# Patient Record
Sex: Male | Born: 1940 | Race: White | Hispanic: No | Marital: Married | Smoking: Former smoker
Health system: Southern US, Community
[De-identification: ages and names within clinical notes are randomized; demographics above are authoritative.]

## PROBLEM LIST (undated history)

## (undated) DIAGNOSIS — E7800 Pure hypercholesterolemia, unspecified: Secondary | ICD-10-CM

## (undated) DIAGNOSIS — N419 Inflammatory disease of prostate, unspecified: Secondary | ICD-10-CM

## (undated) HISTORY — PX: NASAL SEPTUM SURGERY: SHX37

---

## 2008-08-02 ENCOUNTER — Ambulatory Visit: Payer: Self-pay | Admitting: Family Medicine

## 2009-08-21 IMAGING — US US RENAL KIDNEY
1 series · 17 of 19 positions shown · non-contrast
Comparison: none

REASON FOR EXAM: Cramping Back Pain Nausea Eval for Kidney Stones Call
COMMENTS:

[Series 1: us renal kidney · 17 of 19 slices shown]
[im 1/19]
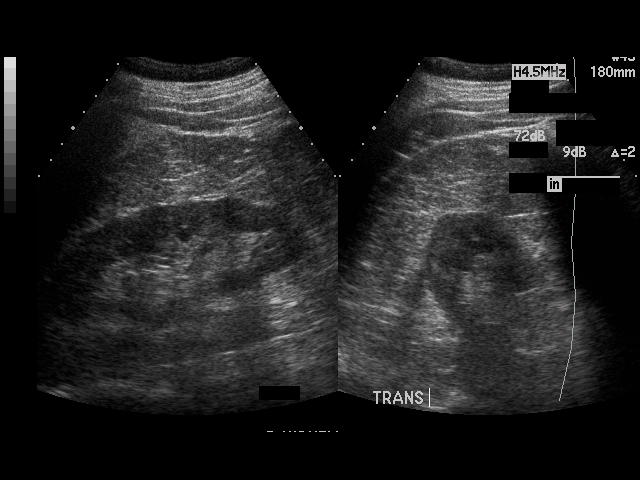
[im 2/19]
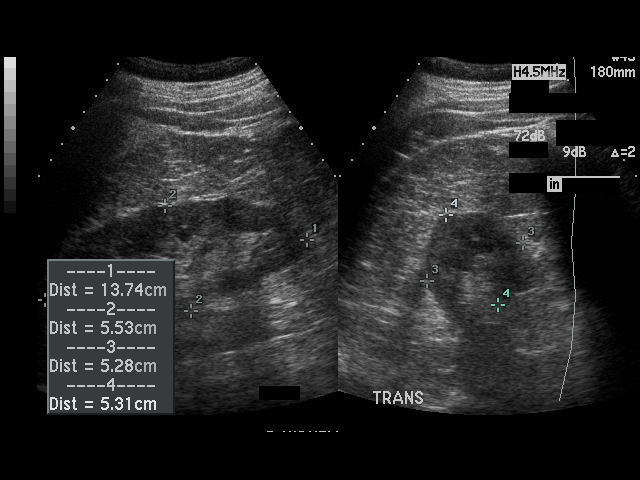
[im 3/19]
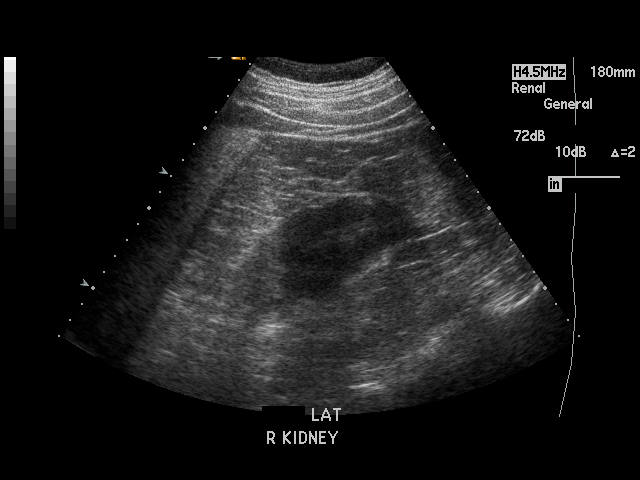
[im 4/19]
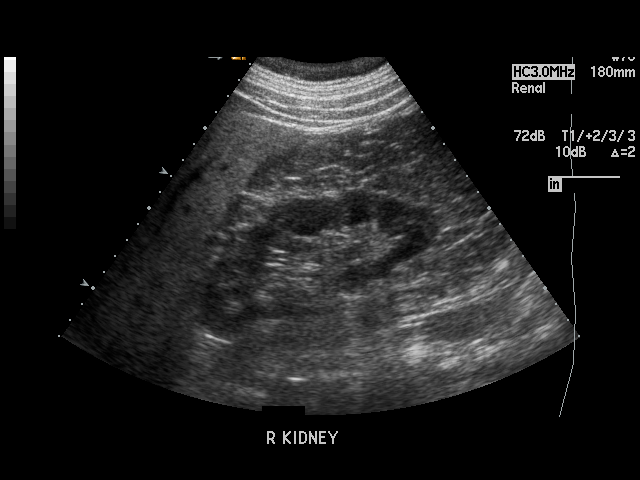
[im 6/19]
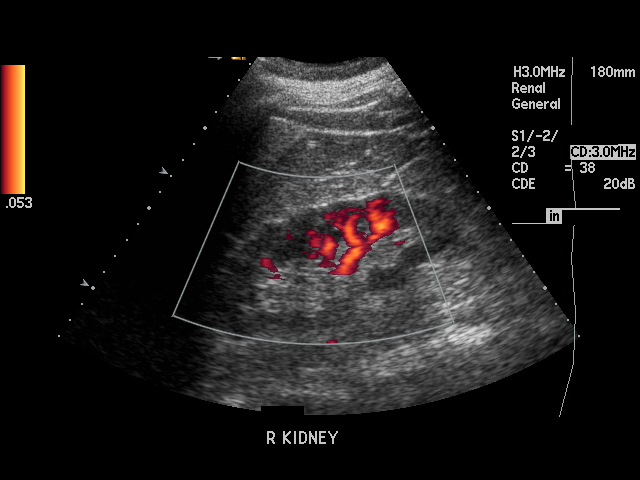
[im 7/19]
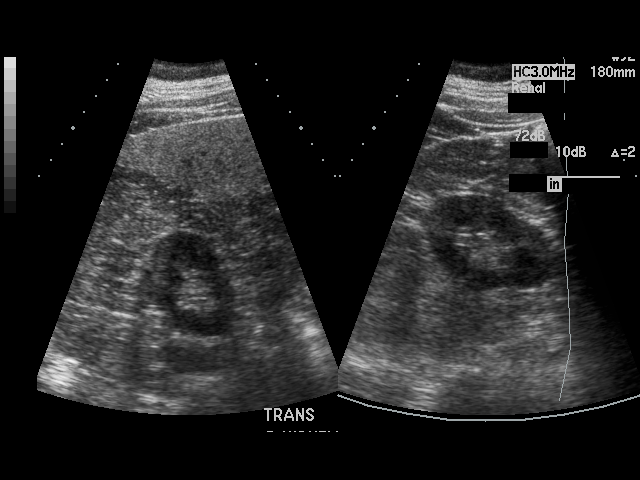
[im 8/19]
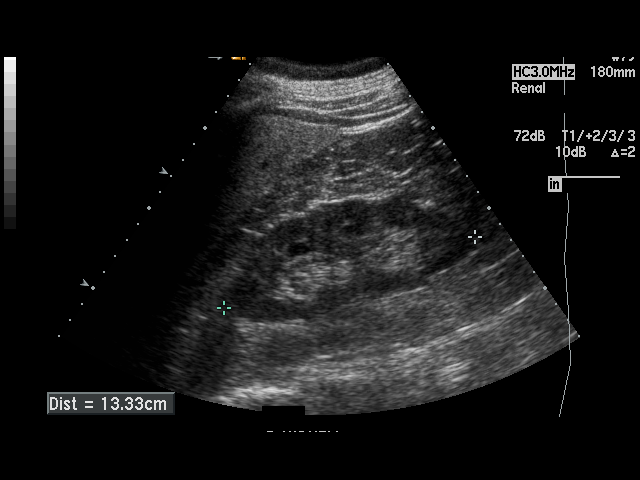
[im 9/19]
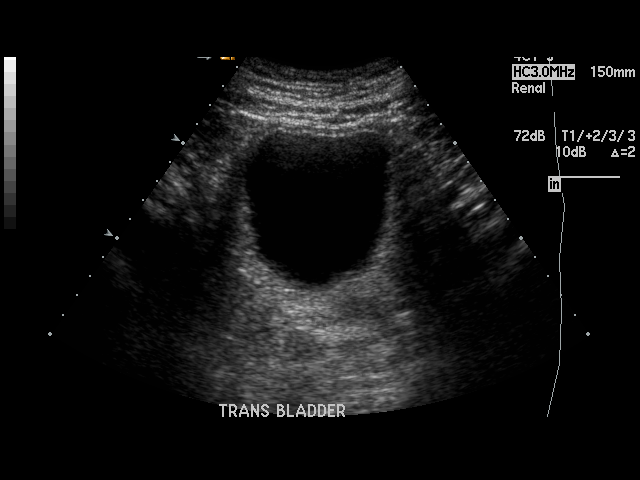
[im 10/19]
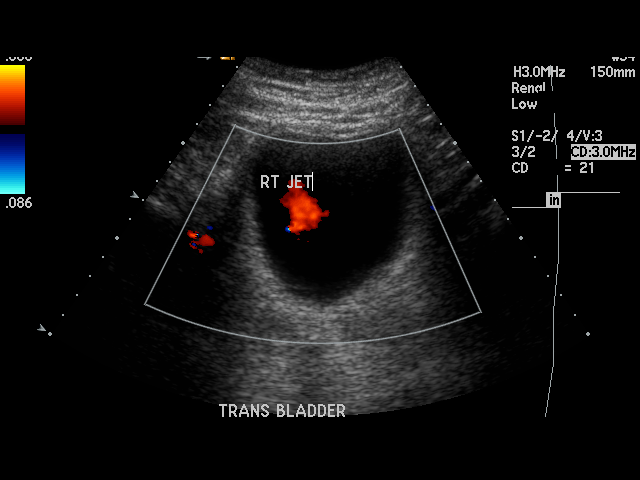
[im 11/19]
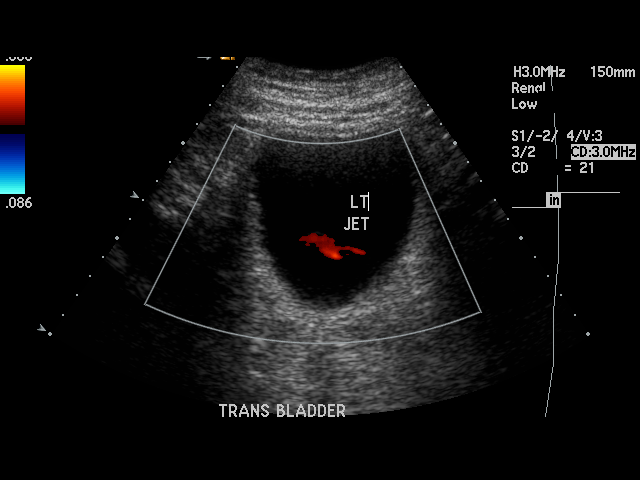
[im 12/19]
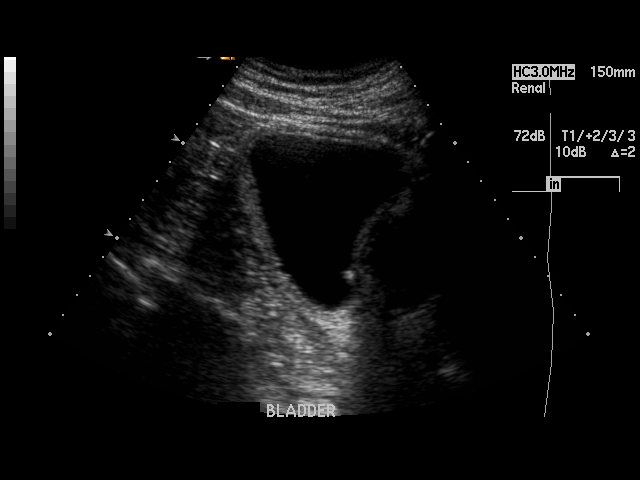
[im 13/19]
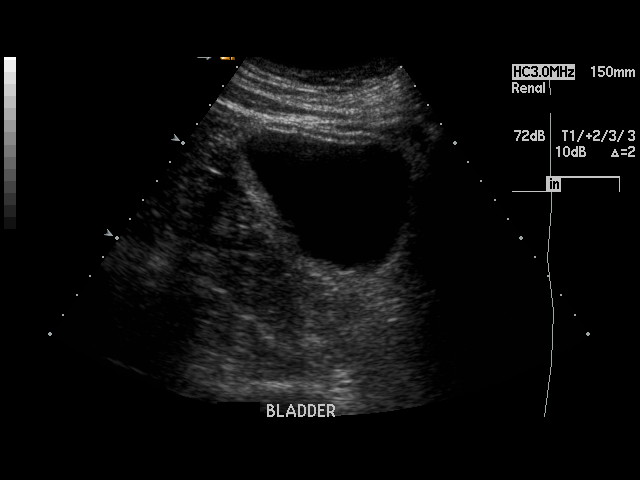
[im 14/19]
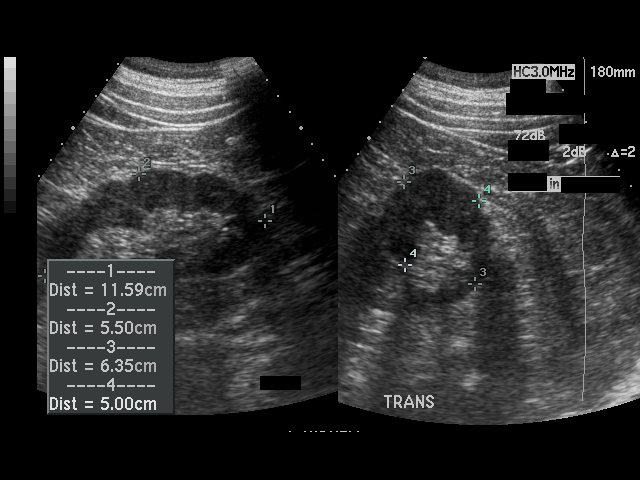
[im 16/19]
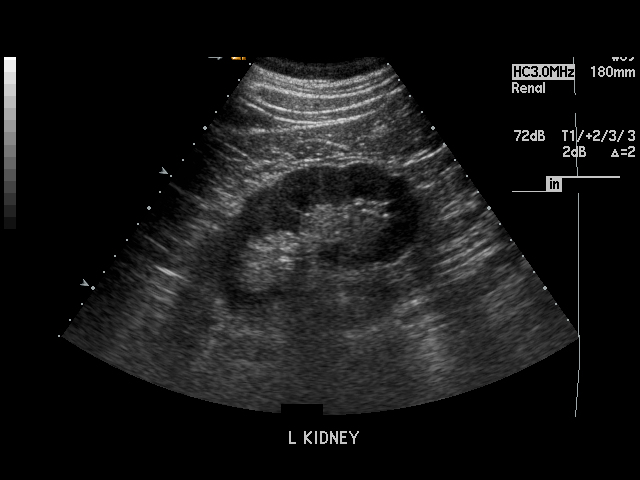
[im 17/19]
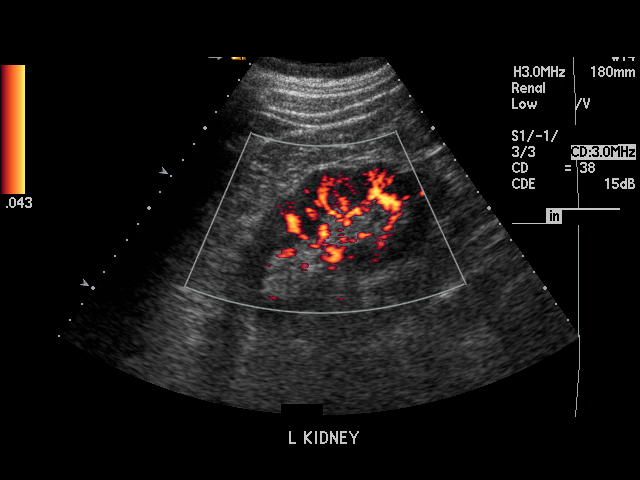
[im 18/19]
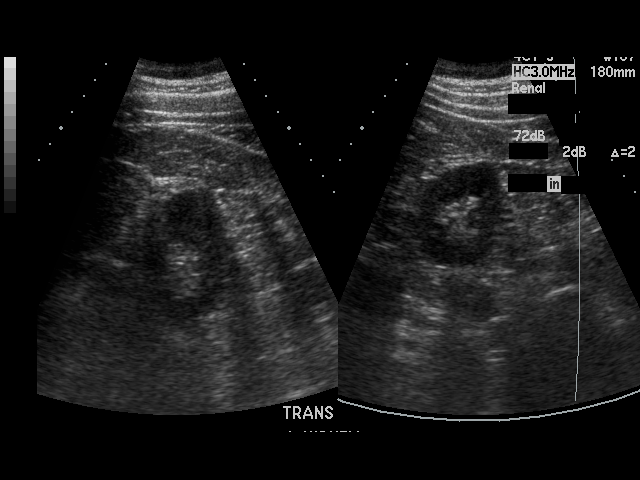
[im 19/19]
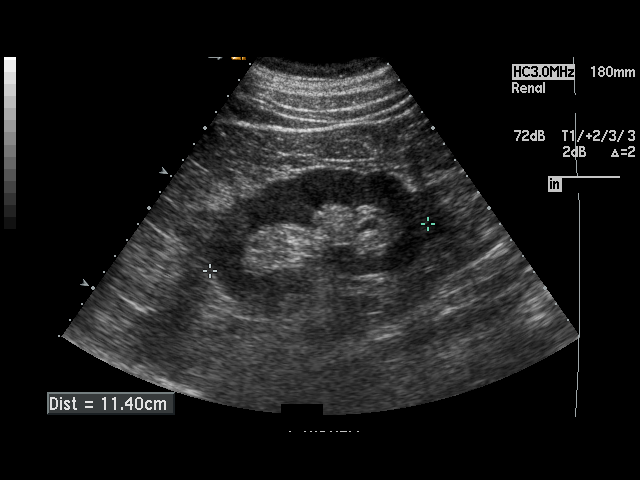

[17 of 19 positions shown; findings below may reference images not displayed]

PROCEDURE:     US  - US KIDNEY  - August 02, 2008  [DATE]

RESULT:     The RIGHT kidney measures 13.33 x 5.28 x 5.31 cm and the LEFT
11.59 x 6.35 x 5.0 cm. There is appropriate corticomedullary differentiation
without evidence of hydronephrosis, masses or calculi. The urinary bladder
is partially distended with urine.
IMPRESSION: Unremarkable bilateral renal ultrasound as described above.

## 2011-10-06 ENCOUNTER — Ambulatory Visit: Payer: Self-pay | Admitting: Family Medicine

## 2014-08-22 ENCOUNTER — Ambulatory Visit: Payer: Self-pay | Admitting: Unknown Physician Specialty

## 2014-11-04 LAB — SURGICAL PATHOLOGY

## 2015-11-25 ENCOUNTER — Encounter: Payer: Self-pay | Admitting: *Deleted

## 2015-11-25 ENCOUNTER — Ambulatory Visit (INDEPENDENT_AMBULATORY_CARE_PROVIDER_SITE_OTHER): Payer: Medicare PPO

## 2015-11-25 ENCOUNTER — Ambulatory Visit
Admission: EM | Admit: 2015-11-25 | Discharge: 2015-11-25 | Disposition: A | Discharge status: Home / Self Care | Payer: Medicare PPO | Attending: Family Medicine | Admitting: Family Medicine

## 2015-11-25 DIAGNOSIS — S93601A Unspecified sprain of right foot, initial encounter: Secondary | ICD-10-CM | POA: Diagnosis not present

## 2015-11-25 HISTORY — DX: Pure hypercholesterolemia, unspecified: E78.00

## 2015-11-25 HISTORY — DX: Inflammatory disease of prostate, unspecified: N41.9

## 2015-11-25 MED ORDER — CELECOXIB 200 MG PO CAPS: 200.0000 mg | ORAL_CAPSULE | Freq: Two times a day (BID) | ORAL | Status: DC

## 2015-11-25 NOTE — Discharge Instructions (Signed)
Elastic Bandage and RICE °WHAT DOES AN ELASTIC BANDAGE DO? °Elastic bandages come in different shapes and sizes. They generally provide support to your injury and reduce swelling while you are healing, but they can perform different functions. Your health care provider will help you to decide what is best for your protection, recovery, or rehabilitation following an injury. °WHAT ARE SOME GENERAL TIPS FOR USING AN ELASTIC BANDAGE? °· Use the bandage as directed by the maker of the bandage that you are using. °· Do not wrap the bandage too tightly. This may cut off the circulation in the arm or leg in the area below the bandage. °¨ If part of your body beyond the bandage becomes blue, numb, cold, swollen, or is more painful, your bandage is most likely too tight. If this occurs, remove your bandage and reapply it more loosely. °· See your health care provider if the bandage seems to be making your problems worse rather than better. °· An elastic bandage should be removed and reapplied every 3-4 hours or as directed by your health care provider. °WHAT IS RICE? °The routine care of many injuries includes rest, ice, compression, and elevation (RICE therapy).  °Rest °Rest is required to allow your body to heal. Generally, you can resume your routine activities when you are comfortable and have been given permission by your health care provider. °Ice °Icing your injury helps to keep the swelling down and it reduces pain. Do not apply ice directly to your skin. °· Put ice in a plastic bag. °· Place a towel between your skin and the bag. °· Leave the ice on for 20 minutes, 2-3 times per day. °Do this for as long as you are directed by your health care provider. °Compression °Compression helps to keep swelling down, gives support, and helps with discomfort. Compression may be done with an elastic bandage. °Elevation °Elevation helps to reduce swelling and it decreases pain. If possible, your injured area should be placed at  or above the level of your heart or the center of your chest. °WHEN SHOULD I SEEK MEDICAL CARE? °You should seek medical care if: °· You have persistent pain and swelling. °· Your symptoms are getting worse rather than improving. °These symptoms may indicate that further evaluation or further X-rays are needed. Sometimes, X-rays may not show a small broken bone (fracture) until a number of days later. Make a follow-up appointment with your health care provider. Ask when your X-ray results will be ready. Make sure that you get your X-ray results. °WHEN SHOULD I SEEK IMMEDIATE MEDICAL CARE? °You should seek immediate medical care if: °· You have a sudden onset of severe pain at or below the area of your injury. °· You develop redness or increased swelling around your injury. °· You have tingling or numbness at or below the area of your injury that does not improve after you remove the elastic bandage. °  °This information is not intended to replace advice given to you by your health care provider. Make sure you discuss any questions you have with your health care provider. °  °Document Released: 12/18/2001 Document Revised: 03/19/2015 Document Reviewed: 02/11/2014 °Elsevier Interactive Patient Education ©2016 [employer] ° °

## 2015-11-25 NOTE — ED Provider Notes (Signed)
CSN: [id number]     Arrival date & time 11/25/15  7829 History   First MD Initiated Contact with Patient 11/25/15 [phone]     Chief Complaint  Patient presents with  . Foot Injury   (Consider location/radiation/quality/duration/timing/severity/associated sxs/prior Treatment) HPI   75 year old gentleman who presents with right lateral foot pain indicating the base of the fifth metatarsal that occurred on Sunday playing golf. States that he rolled his right foot into inversion injuring his foot. States it hurt worse that night but yesterday was much worse which prompted today's visit. There is some redness and swelling in the area maximum tenderness along the proximal shaft near the base of the fifth.      Past Medical History  Diagnosis Date  . Hypercholesteremia   . Prostatitis    Past Surgical History  Procedure Laterality Date  . Nasal septum surgery     History reviewed. No pertinent family history. Social History  Substance Use Topics  . Smoking status: Former Games developer  . Smokeless tobacco: Never Used  . Alcohol Use: Yes    Review of Systems  Constitutional: Positive for activity change. Negative for fever, chills and fatigue.  Musculoskeletal: Positive for joint swelling and gait problem.  All other systems reviewed and are negative.   Allergies  Review of patient's allergies indicates no known allergies.  Home Medications   Prior to Admission medications   Medication Sig Start Date End Date Taking? Authorizing Provider  simvastatin (ZOCOR) 5 MG tablet Take 5 mg by mouth daily at 6 PM.   Yes Historical Provider, MD  terazosin (HYTRIN) 5 MG capsule Take 5 mg by mouth at bedtime.   Yes Historical Provider, MD  celecoxib (CELEBREX) 200 MG capsule Take 1 capsule (200 mg total) by mouth 2 (two) times daily. 11/25/15   Lutricia Feil, PA-C   Meds Ordered and Administered this Visit  Medications - No data to display  BP 144/63 mmHg  Pulse 66  Temp(Src) 97.9 F (36.6  C) (Oral)  Resp 18  Ht  (1.803 m)  Wt 212 lb (96.163 kg)  BMI 29.58 kg/m2  SpO2 100% No data found.   Physical Exam  Constitutional: He is oriented to person, place, and time. He appears well-developed and well-nourished. No distress.  HENT:  Head: Normocephalic and atraumatic.  Eyes: Conjunctivae are normal. Pupils are equal, round, and reactive to light.  Neck: Normal range of motion. Neck supple.  Musculoskeletal: Normal range of motion. He exhibits tenderness. He exhibits no edema.  Examination of the right foot shows some swelling over the lateral aspect. There is tenderness to palpation over the proximal shaft of the fifth metatarsal near the base at this or ecchymosis is appreciated. Patient does have a slight antalgic gait on the right.  Neurological: He is alert and oriented to person, place, and time.  Skin: Skin is warm and dry. He is not diaphoretic.  Psychiatric: He has a normal mood and affect. His behavior is normal. Judgment and thought content normal.  Vitals reviewed.   ED Course  Procedures (including critical care time)  Labs Review Labs Reviewed - No data to display  Imaging Review Dg Foot Complete Right  11/25/2015  CLINICAL DATA:  Rolled foot playing golf. Lateral foot pain at fifth metatarsal base for 2 days. Initial encounter. EXAM: RIGHT FOOT COMPLETE - 3+ VIEW COMPARISON:  None. FINDINGS: There is no evidence of fracture or dislocation. There is no evidence of arthropathy small plantar and dorsal calcaneal  bone spurs noted. IMPRESSION: No acute findings. Electronically Signed   By: Raymond RosenthalJohn  Fletcher M.D.   On: 11/25/2015 09:08     Visual Acuity Review  Right Eye Distance:   Left Eye Distance:   Bilateral Distance:    Right Eye Near:   Left Eye Near:    Bilateral Near:         MDM   1. Sprain of right foot, initial encounter    Discharge Medication List as of 11/25/2015  9:20 AM    Plan: 1. Test/x-ray results and diagnosis reviewed  with patient 2. rx as per orders; risks, benefits, potential side effects reviewed with patient 3. Recommend supportive treatment with symptom avoidance. Recommend ice and elevation with elastic wrap when necessary. Follow-up with a podiatrist to discuss symptoms continue. 4. F/u prn if symptoms worsen or don't improve      Lutricia FeilWilliam P Roemer, PA-C 11/25/15 [phone]

## 2015-11-25 NOTE — ED Notes (Signed)
Patient injured his right foot while playing golf 2 days ago. Swelling is visible on the dorsal lateral side of the right foot. Patient injured his foot the same way 5 years ago.

## 2016-12-13 IMAGING — CR DG FOOT COMPLETE 3+V*R*
3 series · 3 of 3 positions shown · non-contrast
Comparison: None.

CLINICAL DATA: Rolled foot playing golf. Lateral foot pain at fifth
metatarsal base for 2 days. Initial encounter.

EXAM:
RIGHT FOOT COMPLETE - 3+ VIEW

[foot ap]
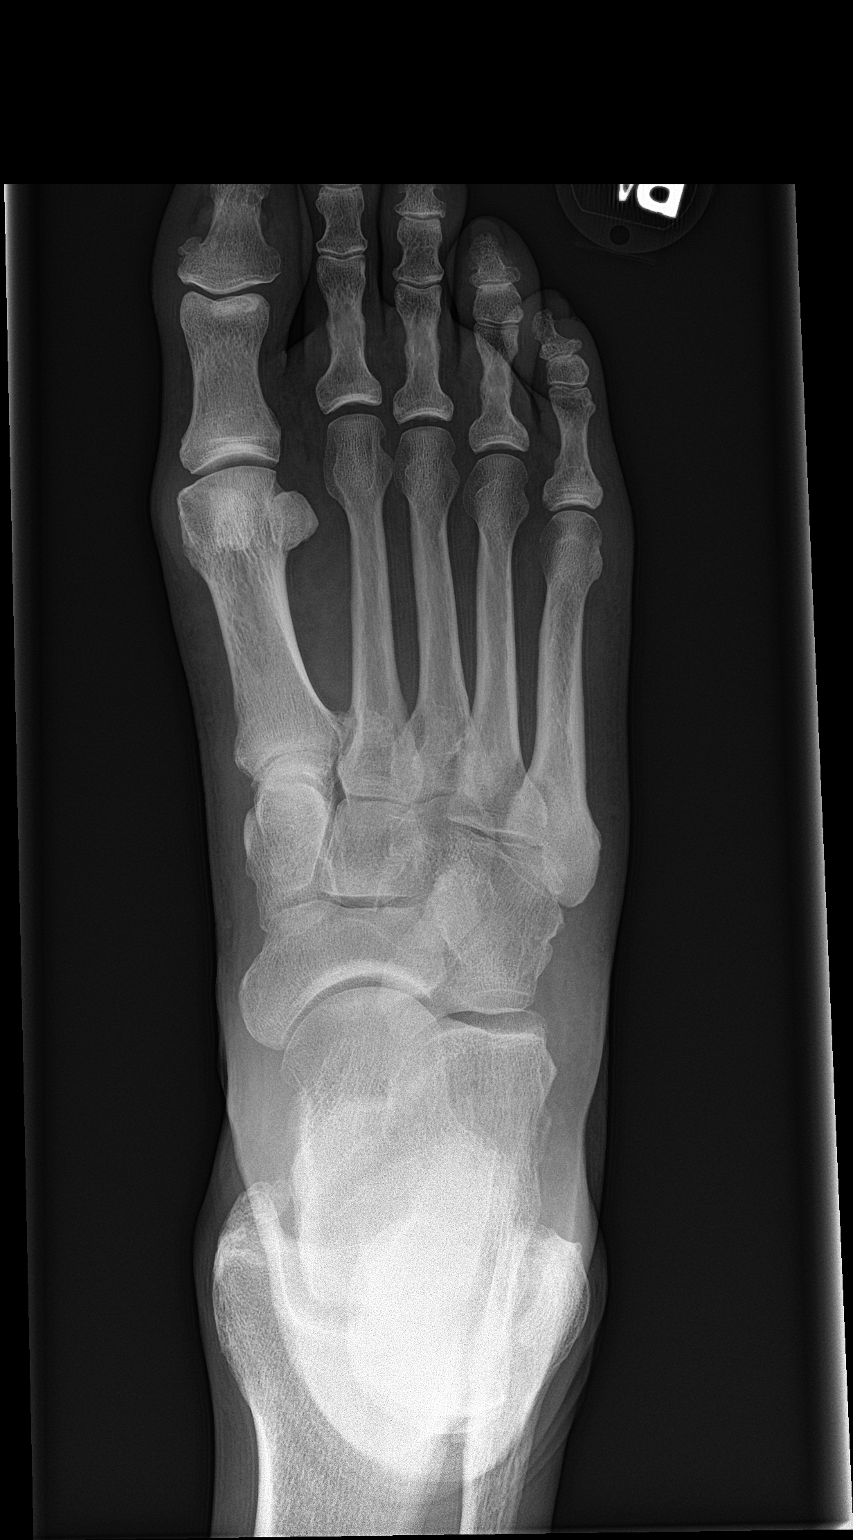

[foot obl]
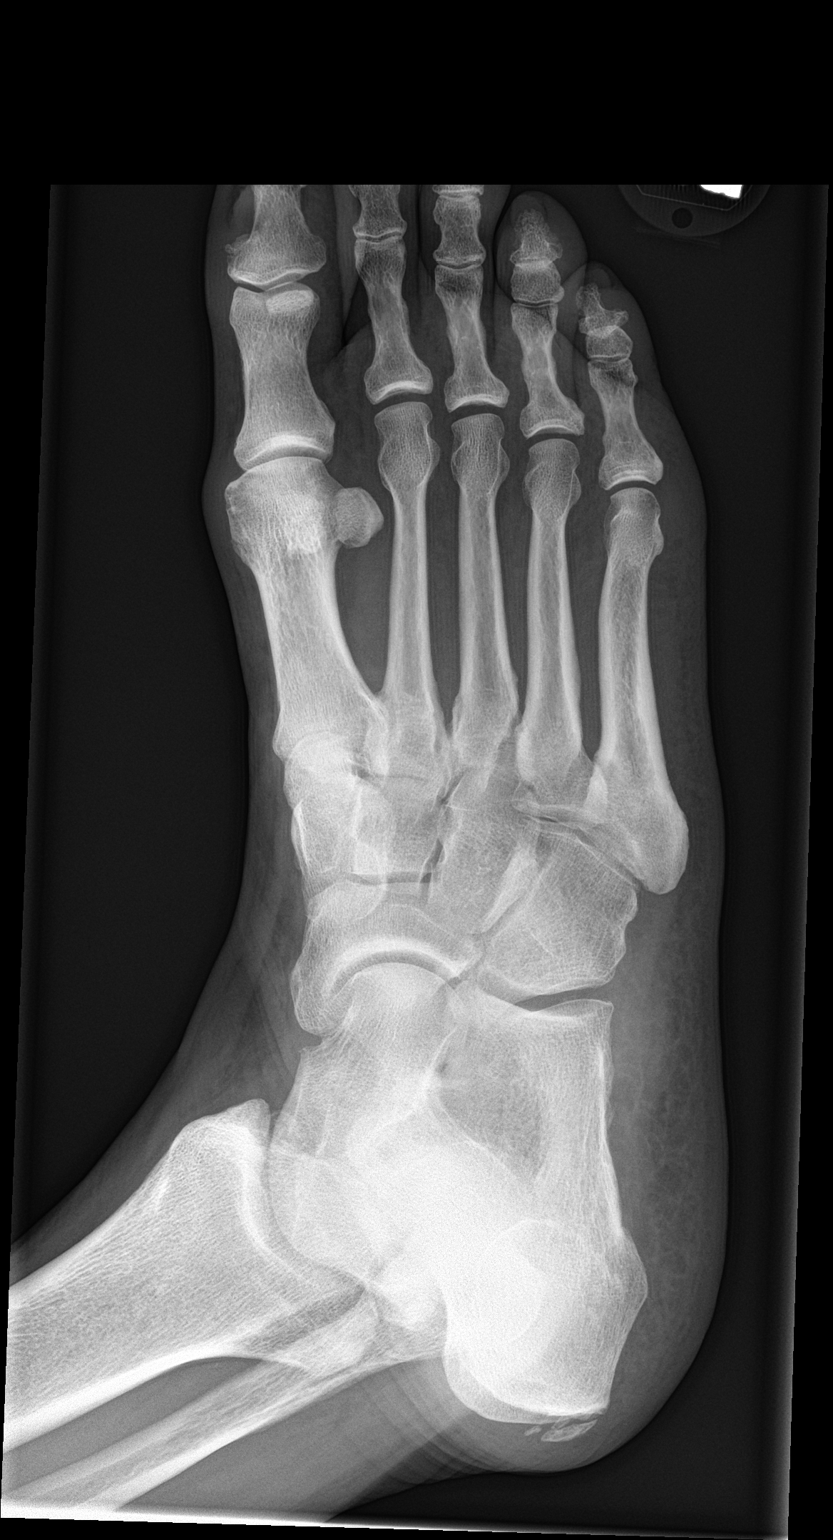

[foot lat]
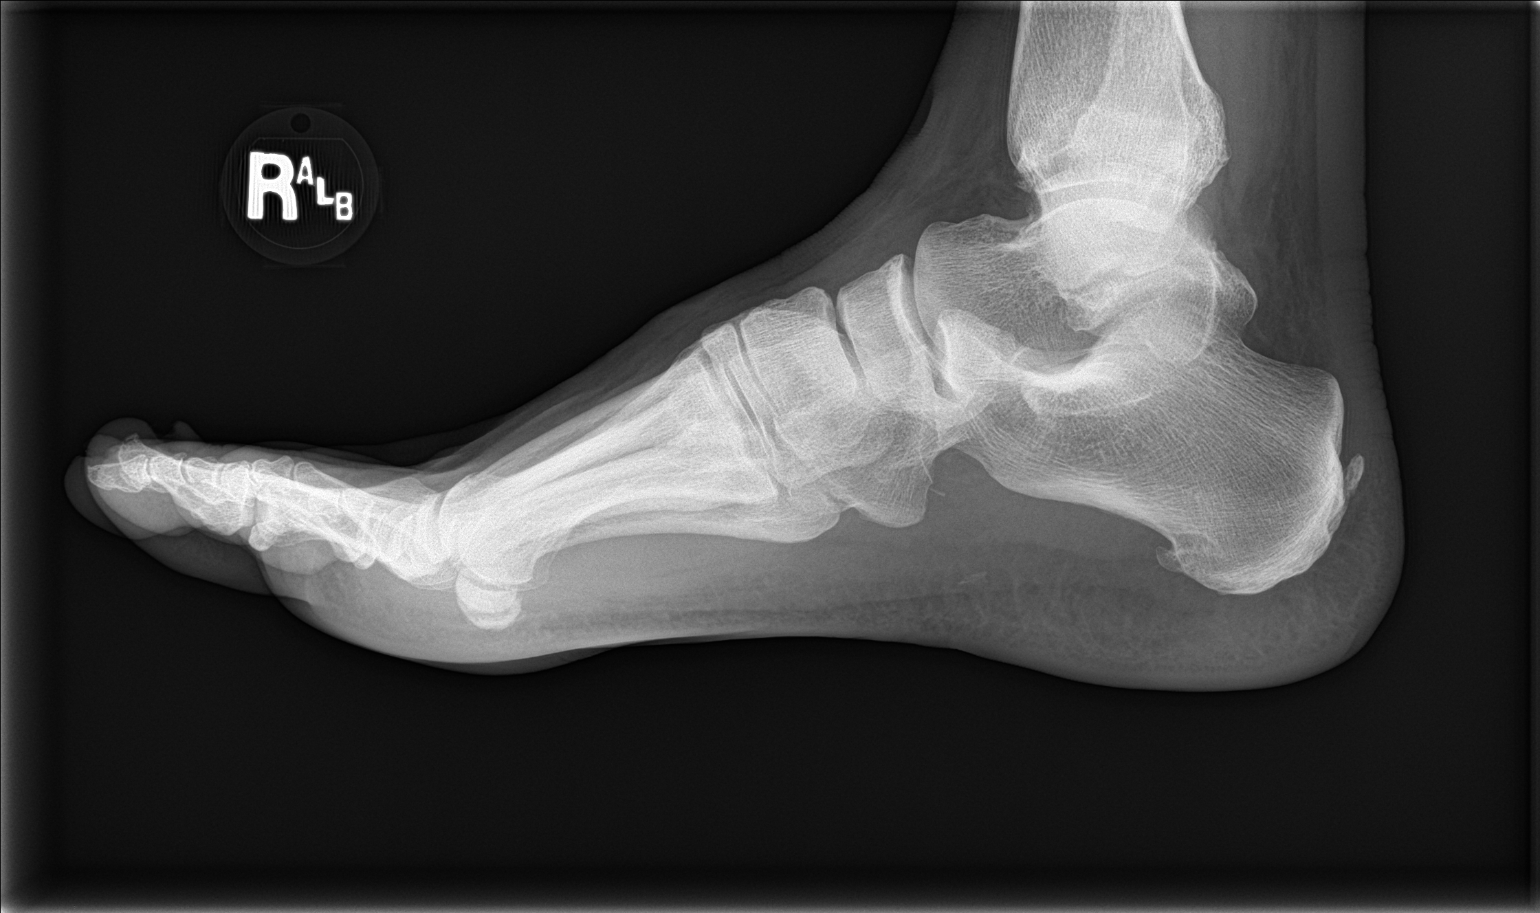

[3 of 3 positions shown; findings below may reference images not displayed]

FINDINGS: There is no evidence of fracture or dislocation. There is no
evidence of arthropathy small plantar and dorsal calcaneal bone
spurs noted.
IMPRESSION: No acute findings.

## 2017-06-18 ENCOUNTER — Other Ambulatory Visit: Payer: Self-pay

## 2017-06-18 ENCOUNTER — Ambulatory Visit
Admission: EM | Admit: 2017-06-18 | Discharge: 2017-06-18 | Disposition: A | Discharge status: Home / Self Care | Payer: Medicare PPO | Attending: Emergency Medicine | Admitting: Emergency Medicine

## 2017-06-18 ENCOUNTER — Encounter: Payer: Self-pay | Admitting: Gynecology

## 2017-06-18 DIAGNOSIS — L03032 Cellulitis of left toe: Secondary | ICD-10-CM

## 2017-06-18 MED ORDER — DOXYCYCLINE HYCLATE 100 MG PO CAPS: 100.0000 mg | ORAL_CAPSULE | Freq: Two times a day (BID) | ORAL | 0 refills | Status: AC

## 2017-06-18 NOTE — Discharge Instructions (Signed)
Soak your foot in tap water in diluted chlorhexidine/Hibiclens soap several times a day.  Ask for this at the drugstore counter.

## 2017-06-18 NOTE — ED Provider Notes (Signed)
HPI  SUBJECTIVE:  Raymond Fletcher is a 76 y.o. male who presents with 3 days of throbbing, stabbing, first left toe pain, erythema, edema.  States the pain is constant and it is now extending to his foot.  He thought he might have an ingrown toenail and tried lifting it with tweezers states that he broke the skin but had no purulent drainage.  Denies fevers, body aches, trauma, numbness, tingling.  No antibiotics in the past month.  No antipyretic in the past 6-8 hours.  He tried Neosporin without improvement in symptoms.  Symptoms are worse wearing shoes, palpation.  He has had identical symptoms performed which were treated with an unknown antibiotic.  He has a past medical history of onychomycosis, no history of diabetes, hypertension.  ZOX:WRUEAVPMD:[place], [facility]   Past Medical History:  Diagnosis Date  . Hypercholesteremia   . Prostatitis     Past Surgical History:  Procedure Laterality Date  . NASAL SEPTUM SURGERY      No family history on file.  Social History   Tobacco Use  . Smoking status: Former Games developermoker  . Smokeless tobacco: Never Used  Substance Use Topics  . Alcohol use: Yes  . Drug use: No    No current facility-administered medications for this encounter.   Current Outpatient Medications:  .  celecoxib (CELEBREX) 200 MG capsule, Take 1 capsule (200 mg total) by mouth 2 (two) times daily., Disp: 30 capsule, Rfl: 0 .  simvastatin (ZOCOR) 5 MG tablet, Take 5 mg by mouth daily at 6 PM., Disp: , Rfl:  .  doxycycline (VIBRAMYCIN) 100 MG capsule, Take 1 capsule (100 mg total) by mouth 2 (two) times daily for 7 days., Disp: 14 capsule, Rfl: 0  No Known Allergies   ROS  As noted in HPI.   Physical Exam  BP 120/76 (BP Location: Left Arm)   Pulse 61   Temp 97.7 F (36.5 C) (Oral)   Resp 16   Ht 5\' 10"  (1.778 m)   Wt 215 lb (97.5 kg)   SpO2 98%   BMI 30.85 kg/m   Constitutional: Well developed, well nourished, no acute distress Eyes:  EOMI, conjunctiva  normal bilaterally HENT: Normocephalic, atraumatic,mucus membranes moist Respiratory: Normal inspiratory effort Cardiovascular: Normal rate GI: nondistended skin: No rash, skin intact Musculoskeletal: Onychomycosis all nails.  Tenderness, erythema, edema at base of left great toe.  No redness streaking up the foot.   No tenderness along the nail folds.  No expressible purulent drainage.  See picture.   Neurologic: Alert & oriented x 3, no focal neuro deficits Psychiatric: Speech and behavior appropriate   ED Course   Medications - No data to display  No orders of the defined types were placed in this encounter.   No results found for this or any previous visit (from the past 24 hour(s)). No results found.  ED Clinical Impression  Cellulitis of toe of left foot   ED Assessment/Plan  Presentation consistent with cellulitis.  Home with doxycycline 100 mg p.o. twice daily for 1 week, chlorhexidine/tap water soaks, follow-up with Dr. Ether GriffinsFowler, his podiatrist in several days if not getting any better.  To the ER if he gets worse.  Discussed MDM, plan and followup with patient. Discussed sn/sx that should prompt return to the ED. patient agrees with plan.   Meds ordered this encounter  Medications  . doxycycline (VIBRAMYCIN) 100 MG capsule    Sig: Take 1 capsule (100 mg total) by mouth 2 (two)  times daily for 7 days.    Dispense:  14 capsule    Refill:  0    *This clinic note was created using Scientist, clinical (histocompatibility and immunogenetics)Dragon dictation software. Therefore, there may be occasional mistakes despite careful proofreading.   ?    Domenick GongMortenson, Cay Kath, MD 06/18/17 [phone]

## 2017-06-18 NOTE — ED Triage Notes (Signed)
Per patient x 3 days left big toe infection x 3 days ago.

## 2021-05-01 DIAGNOSIS — Z23 Encounter for immunization: Secondary | ICD-10-CM | POA: Diagnosis not present

## 2021-06-27 ENCOUNTER — Ambulatory Visit
Admission: EM | Admit: 2021-06-27 | Discharge: 2021-06-27 | Disposition: A | Discharge status: Home / Self Care | Payer: Medicare PPO | Attending: Emergency Medicine | Admitting: Emergency Medicine

## 2021-06-27 ENCOUNTER — Encounter: Payer: Self-pay | Admitting: Emergency Medicine

## 2021-06-27 ENCOUNTER — Other Ambulatory Visit: Payer: Self-pay

## 2021-06-27 DIAGNOSIS — U071 COVID-19: Secondary | ICD-10-CM | POA: Diagnosis not present

## 2021-06-27 MED ORDER — BENZONATATE 100 MG PO CAPS: 200.0000 mg | ORAL_CAPSULE | Freq: Three times a day (TID) | ORAL | 0 refills | Status: AC

## 2021-06-27 MED ORDER — MOLNUPIRAVIR EUA 200MG CAPSULE: 4.0000 | ORAL_CAPSULE | Freq: Two times a day (BID) | ORAL | 0 refills | Status: AC

## 2021-06-27 NOTE — ED Provider Notes (Signed)
[facility]    CSN: [id number] Arrival date & time: 06/27/21  1035      History   Chief Complaint Chief Complaint  Patient presents with   Covid Positive   Generalized Body Aches    HPI Raymond Fletcher is a 80 y.o. male.   HPI  80 year old male here for evaluation of COVID symptoms.  Patient ports that he developed body aches last night and took 3 different home COVID tests of different brands that were all positive.  He states he wanted to make sure that his tests were correct.  He has also had a mild headache and a nonproductive cough.  He denies fever, runny nose nasal congestion, shortness of breath or wheezing, or GI complaints.  Past Medical History:  Diagnosis Date   Hypercholesteremia    Prostatitis     There are no problems to display for this patient.   Past Surgical History:  Procedure Laterality Date   NASAL SEPTUM SURGERY         Home Medications    Prior to Admission medications   Medication Sig Start Date End Date Taking? Authorizing Provider  benzonatate (TESSALON) 100 MG capsule Take 2 capsules (200 mg total) by mouth every 8 (eight) hours. 06/27/21  Yes Becky Augusta, NP  molnupiravir EUA (LAGEVRIO) 200 mg CAPS capsule Take 4 capsules (800 mg total) by mouth 2 (two) times daily for 5 days. 06/27/21 07/02/21 Yes Becky Augusta, NP  simvastatin (ZOCOR) 5 MG tablet Take 5 mg by mouth daily at 6 PM.   Yes [provider]    Family History History reviewed. No pertinent family history.  Social History Social History   Tobacco Use   Smoking status: Former   Smokeless tobacco: Never  Building services engineer Use: Never used  Substance Use Topics   Alcohol use: Yes   Drug use: No     Allergies   Patient has no known allergies.   Review of Systems Review of Systems  Constitutional:  Negative for activity change, appetite change and fever.  HENT:  Negative for congestion, ear pain, rhinorrhea and sore throat.    Respiratory:  Positive for cough. Negative for shortness of breath and wheezing.   Gastrointestinal:  Negative for diarrhea, nausea and vomiting.  Musculoskeletal:  Positive for arthralgias and myalgias.  Skin:  Negative for rash.  Neurological:  Positive for headaches.  Hematological: Negative.     Physical Exam Triage Vital Signs ED Triage Vitals  Enc Vitals Group     BP 06/27/21 1158 (!) 163/75     Pulse Rate 06/27/21 1158 74     Resp 06/27/21 1158 15     Temp 06/27/21 1158 98.5 F (36.9 C)     Temp Source 06/27/21 1158 Oral     SpO2 06/27/21 1158 98 %     Weight 06/27/21 1156 198 lb (89.8 kg)     Height 06/27/21 1156 5\' 11"  (1.803 m)     Head Circumference --      Peak Flow --      Pain Score 06/27/21 1156 4     Pain Loc --      Pain Edu? --      Excl. in GC? --    No data found.  Updated Vital Signs BP (!) 163/75 (BP Location: Left Arm)    Pulse 74    Temp 98.5 F (36.9 C) (Oral)    Resp 15    Ht 5\' 11"  (  1.803 m)    Wt 198 lb (89.8 kg)    SpO2 98%    BMI 27.62 kg/m   Visual Acuity Right Eye Distance:   Left Eye Distance:   Bilateral Distance:    Right Eye Near:   Left Eye Near:    Bilateral Near:     Physical Exam Vitals and nursing note reviewed.  Constitutional:      General: He is not in acute distress.    Appearance: Normal appearance. He is normal weight. He is not ill-appearing.  HENT:     Head: Normocephalic and atraumatic.     Right Ear: Tympanic membrane, ear canal and external ear normal. There is no impacted cerumen.     Left Ear: Tympanic membrane, ear canal and external ear normal. There is no impacted cerumen.     Nose: Nose normal. No congestion or rhinorrhea.     Mouth/Throat:     Mouth: Mucous membranes are moist.     Pharynx: Oropharynx is clear. No posterior oropharyngeal erythema.  Cardiovascular:     Rate and Rhythm: Normal rate and regular rhythm.     Pulses: Normal pulses.     Heart sounds: Normal heart sounds. No murmur  heard.   No gallop.  Pulmonary:     Effort: Pulmonary effort is normal.     Breath sounds: Normal breath sounds. No wheezing, rhonchi or rales.  Musculoskeletal:     Cervical back: Normal range of motion and neck supple.  Lymphadenopathy:     Cervical: No cervical adenopathy.  Skin:    General: Skin is warm and dry.     Capillary Refill: Capillary refill takes less than 2 seconds.     Findings: No erythema or rash.  Neurological:     General: No focal deficit present.     Mental Status: He is alert and oriented to person, place, and time.  Psychiatric:        Mood and Affect: Mood normal.        Behavior: Behavior normal.        Thought Content: Thought content normal.        Judgment: Judgment normal.     UC Treatments / Results  Labs (all labs ordered are listed, but only abnormal results are displayed) Labs Reviewed - No data to display  EKG   Radiology No results found.  Procedures Procedures (including critical care time)  Medications Ordered in UC Medications - No data to display  Initial Impression / Assessment and Plan / UC Course  I have reviewed the triage vital signs and the nursing notes.  Pertinent labs & imaging results that were available during my care of the patient were reviewed by me and considered in my medical decision making (see chart for details).  Patient is a nontoxic-appearing 68-year-old male who tested positive today at home for COVID after developing body aches last night and he came in for evaluation.  His only other symptoms that he endorses are a mild headache and an intermittent nonproductive cough.  He has had no fever, nasal congestion or runny nose, shortness of breath or wheezing, or GI complaints.  His physical exam reveals pearly gray tympanic membranes bilaterally with normal light reflex and clear external auditory canals.  Nasal mucosa is pink and moist without erythema, edema, or discharge.  Oropharyngeal exam is benign.  No  cervical lymphadenopathy appreciable exam.  Cardiopulmonary exam reveals clear lung sounds all fields.  We will treat patient for COVID-19 with  molnupiravir as we do not have any recent blood work in our system and patient does not want to wait for blood work to be performed here in the urgent care today.  I will also give Tessalon Perles to help with his cough symptoms.  Patient to use over-the-counter Tylenol and ibuprofen according to the package instructions as needed for body aches.  ER and return precautions reviewed with patient.   Final Clinical Impressions(s) / UC Diagnoses   Final diagnoses:  COVID-19     Discharge Instructions      You will have to quarantine for 5 days from the start of your symptoms.  After 5 days you can break quarantine if your symptoms have improved and you have not had a fever for 24 hours without taking Tylenol or ibuprofen.  Use over-the-counter Tylenol and ibuprofen as needed for body aches and fever.  Use the Tessalon Perles during the day as needed for cough.  If you develop any increased shortness of breath-especially at rest, you are unable to speak in full sentences, or is a late sign your lips are turning blue you need to go the ER for evaluation.      ED Prescriptions     Medication Sig Dispense Auth. Provider   molnupiravir EUA (LAGEVRIO) 200 mg CAPS capsule Take 4 capsules (800 mg total) by mouth 2 (two) times daily for 5 days. 40 capsule Becky Augusta, NP   benzonatate (TESSALON) 100 MG capsule Take 2 capsules (200 mg total) by mouth every 8 (eight) hours. 21 capsule Becky Augusta, NP      PDMP not reviewed this encounter.   Becky Augusta, NP 06/27/21 1325

## 2021-06-27 NOTE — Discharge Instructions (Addendum)
You will have to quarantine for 5 days from the start of your symptoms.  After 5 days you can break quarantine if your symptoms have improved and you have not had a fever for 24 hours without taking Tylenol or ibuprofen.  Use over-the-counter Tylenol and ibuprofen as needed for body aches and fever.  Use the Tessalon Perles during the day as needed for cough.  If you develop any increased shortness of breath-especially at rest, you are unable to speak in full sentences, or is a late sign your lips are turning blue you need to go the ER for evaluation.

## 2021-06-27 NOTE — ED Triage Notes (Signed)
Patient c/o bodyaches that started last night.  Patient took 3 home covid tests this morning and all were positive. Patient denies fevers.

## 2022-05-12 ENCOUNTER — Ambulatory Visit: Payer: Medicare PPO

## 2022-06-09 ENCOUNTER — Ambulatory Visit (LOCAL_COMMUNITY_HEALTH_CENTER): Payer: Medicare PPO

## 2022-06-09 DIAGNOSIS — Z23 Encounter for immunization: Secondary | ICD-10-CM | POA: Diagnosis not present

## 2022-06-09 DIAGNOSIS — Z7185 Encounter for immunization safety counseling: Secondary | ICD-10-CM

## 2022-06-09 NOTE — Progress Notes (Signed)
  Are you feeling sick today? No   Have you ever received a dose of COVID-19 Vaccine? [employer], [place], [place], [place], Other) Yes  If yes, which vaccine and how many doses?   Pfizer and 7 doses documented. Pt states he had Pfizer dose on 10/02/2021 but did not have any documentation of this dose and not documented in Epic   Did you bring the vaccination record card or other documentation?  Yes   Do you have a health condition or are undergoing treatment that makes you moderately or severely immunocompromised? This would include, but not be limited to: cancer, HIV, organ transplant, immunosuppressive therapy/high-dose corticosteroids, or moderate/severe primary immunodeficiency.  No  Have you received COVID-19 vaccine before or during hematopoietic cell transplant (HCT) or CAR-T-cell therapies? No  Have you ever had an allergic reaction to: (This would include a severe allergic reaction or a reaction that caused hives, swelling, or respiratory distress, including wheezing.) A component of a COVID-19 vaccine or a previous dose of COVID-19 vaccine? No   Have you ever had an allergic reaction to another vaccine (other thanCOVID-19 vaccine) or an injectable medication? (This would include a severe allergic reaction or a reaction that caused hives, swelling, or respiratory distress, including wheezing.)   No    Do you have a history of any of the following:  Myocarditis or Pericarditis No  Dermal fillers:  No  Multisystem Inflammatory Syndrome (MIS-C or MIS-A)? No  COVID-19 disease within the past 3 months? No  Vaccinated with monkeypox vaccine in the last 4 weeks? No  Tolerated covid pfizer Comirnaty 2023-24 vaccine well today. Updated [facility] copy and covid card given. Stayed for 15 min observation without problem. Jerel Shepherd, RN

## 2023-05-27 ENCOUNTER — Ambulatory Visit: Payer: Medicare PPO

## 2023-05-27 DIAGNOSIS — Z23 Encounter for immunization: Secondary | ICD-10-CM | POA: Diagnosis not present

## 2023-05-27 DIAGNOSIS — Z719 Counseling, unspecified: Secondary | ICD-10-CM

## 2023-05-27 NOTE — Progress Notes (Signed)
In nurse clinic today for immunization. Given Covid vaccine today. VIS given . Tolerated vaccine well. Observed no adverse effects noted following vaccine. [facility] updated and copy given to pt.

## 2024-04-13 ENCOUNTER — Other Ambulatory Visit: Payer: Self-pay | Admitting: Family Medicine

## 2024-04-13 NOTE — Progress Notes (Signed)
 Attestation of Attending Supervision of RN:  I was consulted regarding this patient case and agree with COVID vaccination given age.  COVID vaccination to be given based on verbal order from myself.  Please note, we are administering COVID vaccinations per FDA recommendations at this time.   Dorothyann Helling, MD Clinical Services Medical Director [facility] Department 04/13/24  10:02 AM

## 2024-04-13 NOTE — Progress Notes (Deleted)
 Attestation of Attending Supervision of RN:  I was consulted regarding this patient case and agree with COVID vaccination given age.  COVID vaccination to be given based on verbal order from myself.  Please note, we are administering COVID vaccinations per FDA recommendations at this time.   Dorothyann Helling, MD Clinical Services Medical Director [facility] Department 04/13/24  10:02 AM

## 2024-04-16 ENCOUNTER — Ambulatory Visit
